# Patient Record
Sex: Male | Born: 2005 | Race: White | Hispanic: No | Marital: Single | Smoking: Current every day smoker
Health system: Southern US, Community
[De-identification: ages and names within clinical notes are randomized; demographics above are authoritative.]

## PROBLEM LIST (undated history)

## (undated) DIAGNOSIS — F32A Depression, unspecified: Secondary | ICD-10-CM

## (undated) DIAGNOSIS — F419 Anxiety disorder, unspecified: Secondary | ICD-10-CM

## (undated) HISTORY — PX: OTHER SURGICAL HISTORY: SHX169

---
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
## 2024-02-27 ENCOUNTER — Ambulatory Visit: Admitting: Neurology

## 2024-07-06 ENCOUNTER — Ambulatory Visit: Payer: 59 | Admitting: Neurology
